# Patient Record
Sex: Female | Born: 1960 | Race: White | Hispanic: No | Marital: Married | Smoking: Never smoker
Health system: Southern US, Community
[De-identification: ages and names within clinical notes are randomized; demographics above are authoritative.]

## PROBLEM LIST (undated history)

## (undated) DIAGNOSIS — I10 Essential (primary) hypertension: Secondary | ICD-10-CM

## (undated) DIAGNOSIS — E7800 Pure hypercholesterolemia, unspecified: Secondary | ICD-10-CM

---

## 2001-12-05 HISTORY — PX: BREAST BIOPSY: SHX20

## 2007-08-21 ENCOUNTER — Ambulatory Visit (HOSPITAL_COMMUNITY): Admission: RE | Admit: 2007-08-21 | Discharge: 2007-08-21 | Payer: Self-pay | Admitting: Obstetrics and Gynecology

## 2007-08-23 ENCOUNTER — Encounter: Admission: RE | Admit: 2007-08-23 | Discharge: 2007-08-23 | Payer: Self-pay | Admitting: Obstetrics and Gynecology

## 2008-02-20 ENCOUNTER — Encounter: Admission: RE | Admit: 2008-02-20 | Discharge: 2008-02-20 | Payer: Self-pay | Admitting: Obstetrics & Gynecology

## 2008-02-20 ENCOUNTER — Encounter (INDEPENDENT_AMBULATORY_CARE_PROVIDER_SITE_OTHER): Payer: Self-pay | Admitting: Radiology

## 2009-07-28 ENCOUNTER — Encounter: Admission: RE | Admit: 2009-07-28 | Discharge: 2009-07-28 | Payer: Self-pay | Admitting: Obstetrics & Gynecology

## 2010-12-27 ENCOUNTER — Encounter: Payer: Self-pay | Admitting: Obstetrics & Gynecology

## 2013-10-07 ENCOUNTER — Other Ambulatory Visit (HOSPITAL_COMMUNITY): Payer: Self-pay | Admitting: Family Medicine

## 2013-10-07 DIAGNOSIS — Z139 Encounter for screening, unspecified: Secondary | ICD-10-CM

## 2013-10-10 ENCOUNTER — Ambulatory Visit (HOSPITAL_COMMUNITY)
Admission: RE | Admit: 2013-10-10 | Discharge: 2013-10-10 | Disposition: A | Discharge status: Home / Self Care | Payer: 59 | Source: Ambulatory Visit | Attending: Family Medicine | Admitting: Family Medicine

## 2013-10-10 DIAGNOSIS — Z139 Encounter for screening, unspecified: Secondary | ICD-10-CM

## 2013-10-10 DIAGNOSIS — Z1231 Encounter for screening mammogram for malignant neoplasm of breast: Secondary | ICD-10-CM | POA: Insufficient documentation

## 2015-06-14 ENCOUNTER — Emergency Department (HOSPITAL_COMMUNITY)
Admission: EM | Admit: 2015-06-14 | Discharge: 2015-06-14 | Disposition: A | Discharge status: Home / Self Care | Payer: 59 | Attending: Emergency Medicine | Admitting: Emergency Medicine

## 2015-06-14 ENCOUNTER — Encounter (HOSPITAL_COMMUNITY): Payer: Self-pay | Admitting: Emergency Medicine

## 2015-06-14 ENCOUNTER — Emergency Department (HOSPITAL_COMMUNITY): Payer: 59

## 2015-06-14 DIAGNOSIS — R109 Unspecified abdominal pain: Secondary | ICD-10-CM

## 2015-06-14 DIAGNOSIS — N83201 Unspecified ovarian cyst, right side: Secondary | ICD-10-CM

## 2015-06-14 DIAGNOSIS — E780 Pure hypercholesterolemia: Secondary | ICD-10-CM | POA: Diagnosis not present

## 2015-06-14 DIAGNOSIS — N8320 Unspecified ovarian cysts: Secondary | ICD-10-CM | POA: Diagnosis not present

## 2015-06-14 DIAGNOSIS — I10 Essential (primary) hypertension: Secondary | ICD-10-CM | POA: Insufficient documentation

## 2015-06-14 DIAGNOSIS — Z79899 Other long term (current) drug therapy: Secondary | ICD-10-CM | POA: Diagnosis not present

## 2015-06-14 DIAGNOSIS — R1031 Right lower quadrant pain: Secondary | ICD-10-CM | POA: Diagnosis present

## 2015-06-14 HISTORY — DX: Pure hypercholesterolemia, unspecified: E78.00

## 2015-06-14 HISTORY — DX: Essential (primary) hypertension: I10

## 2015-06-14 LAB — CBC WITH DIFFERENTIAL/PLATELET
BASOS PCT: 2 % — AB (ref 0–1)
Basophils Absolute: 0.1 10*3/uL (ref 0.0–0.1)
EOS ABS: 0.2 10*3/uL (ref 0.0–0.7)
EOS PCT: 4 % (ref 0–5)
HEMATOCRIT: 36.1 % (ref 36.0–46.0)
Hemoglobin: 11.9 g/dL — ABNORMAL LOW (ref 12.0–15.0)
Lymphocytes Relative: 32 % (ref 12–46)
Lymphs Abs: 1.6 10*3/uL (ref 0.7–4.0)
MCH: 27.9 pg (ref 26.0–34.0)
MCHC: 33 g/dL (ref 30.0–36.0)
MCV: 84.5 fL (ref 78.0–100.0)
MONO ABS: 0.4 10*3/uL (ref 0.1–1.0)
MONOS PCT: 8 % (ref 3–12)
Neutro Abs: 2.6 10*3/uL (ref 1.7–7.7)
Neutrophils Relative %: 54 % (ref 43–77)
Platelets: 249 10*3/uL (ref 150–400)
RBC: 4.27 MIL/uL (ref 3.87–5.11)
RDW: 13.8 % (ref 11.5–15.5)
WBC: 4.8 10*3/uL (ref 4.0–10.5)

## 2015-06-14 LAB — COMPREHENSIVE METABOLIC PANEL
ALK PHOS: 44 U/L (ref 38–126)
ALT: 14 U/L (ref 14–54)
AST: 16 U/L (ref 15–41)
Albumin: 3.9 g/dL (ref 3.5–5.0)
Anion gap: 9 (ref 5–15)
BILIRUBIN TOTAL: 0.9 mg/dL (ref 0.3–1.2)
BUN: 10 mg/dL (ref 6–20)
CHLORIDE: 102 mmol/L (ref 101–111)
CO2: 27 mmol/L (ref 22–32)
CREATININE: 0.66 mg/dL (ref 0.44–1.00)
Calcium: 9 mg/dL (ref 8.9–10.3)
GFR calc Af Amer: >60 mL/min (ref >60)
Glucose, Bld: 94 mg/dL (ref 65–99)
POTASSIUM: 3.5 mmol/L (ref 3.5–5.1)
SODIUM: 138 mmol/L (ref 135–145)
Total Protein: 7.3 g/dL (ref 6.5–8.1)

## 2015-06-14 LAB — URINALYSIS, ROUTINE W REFLEX MICROSCOPIC
BILIRUBIN URINE: NEGATIVE
Glucose, UA: NEGATIVE mg/dL
HGB URINE DIPSTICK: NEGATIVE
Ketones, ur: NEGATIVE mg/dL
Leukocytes, UA: NEGATIVE
NITRITE: NEGATIVE
PH: 6 (ref 5.0–8.0)
Protein, ur: NEGATIVE mg/dL
SPECIFIC GRAVITY, URINE: 1.01 (ref 1.005–1.030)
Urobilinogen, UA: 0.2 mg/dL (ref 0.0–1.0)

## 2015-06-14 LAB — LIPASE, BLOOD: LIPASE: 18 U/L — AB (ref 22–51)

## 2015-06-14 MED ORDER — IOHEXOL 300 MG/ML  SOLN
50.0000 mL | Freq: Once | INTRAMUSCULAR | Status: AC | PRN
  Administered 2015-06-14: 50 mL via ORAL

## 2015-06-14 MED ORDER — SODIUM CHLORIDE 0.9 % IV BOLUS (SEPSIS)
1000.0000 mL | Freq: Once | INTRAVENOUS | Status: AC
  Administered 2015-06-14: 1000 mL via INTRAVENOUS

## 2015-06-14 MED ORDER — SODIUM CHLORIDE 0.9 % IV SOLN
INTRAVENOUS | Status: DC
  Administered 2015-06-14: 13:00:00 via INTRAVENOUS

## 2015-06-14 MED ORDER — TRAMADOL HCL 50 MG PO TABS: 50.0000 mg | ORAL_TABLET | Freq: Four times a day (QID) | ORAL | Status: AC | PRN

## 2015-06-14 MED ORDER — ONDANSETRON HCL 4 MG/2ML IJ SOLN
4.0000 mg | Freq: Once | INTRAMUSCULAR | Status: AC
  Administered 2015-06-14: 4 mg via INTRAVENOUS
  Filled 2015-06-14: qty 2

## 2015-06-14 MED ORDER — FENTANYL CITRATE (PF) 100 MCG/2ML IJ SOLN
50.0000 ug | Freq: Once | INTRAMUSCULAR | Status: AC
  Administered 2015-06-14: 50 ug via INTRAVENOUS
  Filled 2015-06-14: qty 2

## 2015-06-14 MED ORDER — HYDROCODONE-ACETAMINOPHEN 5-325 MG PO TABS: 1.0000 | ORAL_TABLET | Freq: Four times a day (QID) | ORAL | Status: AC | PRN

## 2015-06-14 MED ORDER — IOHEXOL 300 MG/ML  SOLN
100.0000 mL | Freq: Once | INTRAMUSCULAR | Status: AC | PRN
  Administered 2015-06-14: 100 mL via INTRAVENOUS

## 2015-06-14 NOTE — Discharge Instructions (Signed)
Prepare point we are record doctors scheduled for this week. Also important to follow-up with your GYN doctor as well. CT scan without significant findings other than a right ovarian cyst. Not clear whether this is responsible for all your pain. Take the tramadol on a regular basis. Supplement with hydrocodone as needed for more severe pain. Return for any new or worse symptoms.

## 2015-06-14 NOTE — ED Notes (Signed)
Patient c/o intermittent right side abd pain that started on Wednesday that is progressively getting worse. Denies any nausea, vomiting, diarrhea, or urinary symptoms. Per patient has not ate since yesterday. Per patient last BM last night, no blood noted. Per patient unsure of fever but did have some chills this morning.

## 2015-06-14 NOTE — ED Provider Notes (Signed)
CSN: [id number]     Arrival date & time 06/14/15  1143 History  This chart was scribed for Vanetta MuldersScott Isidore Margraf, MD by Elon SpannerGarrett Cook, ED Scribe. This patient was seen in room APA19/APA19 and the patient's care was started at 12:08 PM.   Chief Complaint  Patient presents with  . Abdominal Pain   HPI HPI Comments: Alexandra Estrada is a [age] y.o. female who presents to the Emergency Department complaining of waxing and waning RUQ pain with radiation to the back and RLQ onset 4 days ago.  At worse her pain was 8/10 but it is currently rated 6/10.  She also reports associated belching, bloating, chills this morning, and slight nausea.  She denies vomiting, dysuria, hematuria, fever.   Past Medical History  Diagnosis Date  . Hypertension   . High cholesterol    Past Surgical History  Procedure Laterality Date  . Cesarean section     Family History  Problem Relation Age of Onset  . Diabetes Other    History  Substance Use Topics  . Smoking status: Never Smoker   . Smokeless tobacco: Never Used  . Alcohol Use: No   OB History    Gravida Para Term Preterm AB TAB SAB Ectopic Multiple Living   3 3 3       3      Review of Systems  Constitutional: Positive for chills. Negative for fever.  HENT: Negative for rhinorrhea and sore throat.   Eyes: Negative for visual disturbance.  Respiratory: Positive for cough. Negative for shortness of breath.   Cardiovascular: Negative for chest pain and leg swelling.  Gastrointestinal: Positive for nausea and abdominal pain. Negative for vomiting and diarrhea.  Genitourinary: Negative for dysuria and hematuria.  Musculoskeletal: Positive for back pain. Negative for neck pain.  Skin: Negative for rash.  Neurological: Negative for headaches.  Hematological: Does not bruise/bleed easily.  Psychiatric/Behavioral: Negative for confusion.      Allergies  Bee venom; Beef-derived products; and Pork-derived products  Home Medications   Prior to Admission  medications   Medication Sig Start Date End Date Taking? Authorizing Provider  acetaminophen (TYLENOL) 500 MG tablet Take 500 mg by mouth every 6 (six) hours as needed for moderate pain.   Yes Historical Provider, MD  Cholecalciferol (VITAMIN D PO) Take 1 tablet by mouth daily.   Yes Historical Provider, MD  Cyanocobalamin (VITAMIN B 12 PO) Take 1 tablet by mouth daily.   Yes Historical Provider, MD  lisinopril-hydrochlorothiazide (PRINZIDE,ZESTORETIC) 20-25 MG per tablet Take 1 tablet by mouth daily.   Yes Historical Provider, MD  montelukast (SINGULAIR) 10 MG tablet Take 10 mg by mouth at bedtime.   Yes Historical Provider, MD  HYDROcodone-acetaminophen (NORCO/VICODIN) 5-325 MG per tablet Take 1-2 tablets by mouth every 6 (six) hours as needed. 06/14/15   Vanetta MuldersScott Juell Radney, MD  traMADol (ULTRAM) 50 MG tablet Take 1 tablet (50 mg total) by mouth every 6 (six) hours as needed. 06/14/15   Vanetta MuldersScott Abbiegail Landgren, MD   BP 145/75 mmHg  Pulse 78  Temp(Src) 98.1 F (36.7 C) (Oral)  Resp 20  Ht 5\' 8"  (1.727 m)  Wt 190 lb (86.183 kg)  BMI 28.90 kg/m2  SpO2 100%  LMP 05/18/2015 Physical Exam  Constitutional: She is oriented to person, place, and time. She appears well-developed and well-nourished. No distress.  HENT:  Head: Normocephalic and atraumatic.  Eyes: Conjunctivae and EOM are normal.  Sclera normal.  Eyes track normal.  Pupils normal.  Neck: Neck supple. No  tracheal deviation present.  Cardiovascular: Normal rate and regular rhythm.   Pulmonary/Chest: Effort normal. No respiratory distress.  Lungs CTA bilaterally.   Abdominal: Bowel sounds are normal. There is tenderness.  LLQ and RLQ pain.  RUQ pain greater than other areas.    Musculoskeletal: Normal range of motion. She exhibits no edema.  No ankle swelling.   Neurological: She is alert and oriented to person, place, and time.  Skin: Skin is warm and dry.  Psychiatric: She has a normal mood and affect. Her behavior is normal.  Nursing  note and vitals reviewed.   ED Course  Procedures (including critical care time)  DIAGNOSTIC STUDIES: Oxygen Saturation is 100% on RA, normal by my interpretation.    COORDINATION OF CARE:  12:16 PM Discussed treatment plan with patient at bedside.  Patient acknowledges and agrees with plan.    Labs Review Labs Reviewed  LIPASE, BLOOD - Abnormal; Notable for the following:    Lipase 18 (*)    All other components within normal limits  CBC WITH DIFFERENTIAL/PLATELET - Abnormal; Notable for the following:    Hemoglobin 11.9 (*)    Basophils Relative 2 (*)    All other components within normal limits  URINALYSIS, ROUTINE W REFLEX MICROSCOPIC (NOT AT [facility])  COMPREHENSIVE METABOLIC PANEL   Results for orders placed or performed during the hospital encounter of 06/14/15  Lipase, blood  Result Value Ref Range   Lipase 18 (L) 22 - 51 U/L  Urinalysis, Routine w reflex microscopic (not at [facility])  Result Value Ref Range   Color, Urine YELLOW YELLOW   APPearance CLEAR CLEAR   Specific Gravity, Urine 1.010 1.005 - 1.030   pH 6.0 5.0 - 8.0   Glucose, UA NEGATIVE NEGATIVE mg/dL   Hgb urine dipstick NEGATIVE NEGATIVE   Bilirubin Urine NEGATIVE NEGATIVE   Ketones, ur NEGATIVE NEGATIVE mg/dL   Protein, ur NEGATIVE NEGATIVE mg/dL   Urobilinogen, UA 0.2 0.0 - 1.0 mg/dL   Nitrite NEGATIVE NEGATIVE   Leukocytes, UA NEGATIVE NEGATIVE  Comprehensive metabolic panel  Result Value Ref Range   Sodium 138 135 - 145 mmol/L   Potassium 3.5 3.5 - 5.1 mmol/L   Chloride 102 101 - 111 mmol/L   CO2 27 22 - 32 mmol/L   Glucose, Bld 94 65 - 99 mg/dL   BUN 10 6 - 20 mg/dL   Creatinine, Ser 1.61 0.44 - 1.00 mg/dL   Calcium 9.0 8.9 - 09.6 mg/dL   Total Protein 7.3 6.5 - 8.1 g/dL   Albumin 3.9 3.5 - 5.0 g/dL   AST 16 15 - 41 U/L   ALT 14 14 - 54 U/L   Alkaline Phosphatase 44 38 - 126 U/L   Total Bilirubin 0.9 0.3 - 1.2 mg/dL   GFR calc non Af Amer >60 >60 mL/min   GFR calc Af Amer >60 >60 mL/min    Anion gap 9 5 - 15  CBC with Differential/Platelet  Result Value Ref Range   WBC 4.8 4.0 - 10.5 K/uL   RBC 4.27 3.87 - 5.11 MIL/uL   Hemoglobin 11.9 (L) 12.0 - 15.0 g/dL   HCT 04.5 40.9 - 81.1 %   MCV 84.5 78.0 - 100.0 fL   MCH 27.9 26.0 - 34.0 pg   MCHC 33.0 30.0 - 36.0 g/dL   RDW 91.4 78.2 - 95.6 %   Platelets 249 150 - 400 K/uL   Neutrophils Relative % 54 43 - 77 %   Neutro Abs 2.6 1.7 -  7.7 K/uL   Lymphocytes Relative 32 12 - 46 %   Lymphs Abs 1.6 0.7 - 4.0 K/uL   Monocytes Relative 8 3 - 12 %   Monocytes Absolute 0.4 0.1 - 1.0 K/uL   Eosinophils Relative 4 0 - 5 %   Eosinophils Absolute 0.2 0.0 - 0.7 K/uL   Basophils Relative 2 (H) 0 - 1 %   Basophils Absolute 0.1 0.0 - 0.1 K/uL     Imaging Review Dg Chest 2 View  06/14/2015   CLINICAL DATA:  Right-sided chest pain which began on Wednesday. Progressively getting worse.  EXAM: CHEST  2 VIEW  COMPARISON:  None.  FINDINGS: The heart size and mediastinal contours are within normal limits. Both lungs are clear. The visualized skeletal structures are unremarkable.  IMPRESSION: Normal chest x-ray.   Electronically Signed   By: Rudie Meyer M.D.   On: 06/14/2015 14:32   Ct Abdomen Pelvis W Contrast  06/14/2015   CLINICAL DATA:  Intermittent right-sided abdominal pain since Wednesday, progressively getting worse.  EXAM: CT ABDOMEN AND PELVIS WITH CONTRAST  TECHNIQUE: Multidetector CT imaging of the abdomen and pelvis was performed using the standard protocol following bolus administration of intravenous contrast.  CONTRAST:  50mL OMNIPAQUE IOHEXOL 300 MG/ML SOLN, OMNIPAQUE IOHEXOL 300 MG/ML SOLN  COMPARISON:  None.  FINDINGS: Lower chest: The lung bases are clear of acute process. No pleural effusion or pulmonary lesions. The heart is normal in size. No pericardial effusion. The distal esophagus and aorta are unremarkable.  Hepatobiliary: No focal hepatic lesions or intrahepatic biliary dilatation. The gallbladder is normal. No  common bile duct dilatation.  Pancreas: No mass lesions, inflammatory changes or ductal dilatation.  Spleen: Normal size.  No focal lesions.  Adrenals/Urinary Tract: The adrenal glands are normal.  Both kidneys are normal. No obstructing ureteral calculi or bladder calculi  Stomach/Bowel: The stomach, duodenum, small bowel and colon are unremarkable. No inflammatory changes, mass lesions or obstructive findings. The terminal ileum is normal. The appendix is normal.  Vascular/Lymphatic: No mesenteric or retroperitoneal mass or adenopathy. Small scattered lymph nodes are noted. The aorta and branch vessels are normal. The major venous structures are patent.  Other: The uterus and ovaries are unremarkable. There is a simple appearing 4 cm cyst associated with the right ovary. No pelvic mass or adenopathy. No free pelvic fluid collections. No inguinal mass or adenopathy.  Musculoskeletal: No significant bony findings. Left convex lumbar scoliosis and advanced degenerative disc disease at L5-S1.  IMPRESSION: No acute abdominal/ pelvic findings, mass lesions or adenopathy.  Simple appearing 4 cm cyst associated with the right ovary.   Electronically Signed   By: Rudie Meyer M.D.   On: 06/14/2015 14:39     EKG Interpretation   Date/Time:  Sunday June 14 2015 13:24:07 EDT Ventricular Rate:  61 PR Interval:  164 QRS Duration: 108 QT Interval:  425 QTC Calculation: 428 R Axis:   59 Text Interpretation:  Sinus rhythm RSR' in V1 or V2, right VCD or RVH No  previous ECGs available Confirmed by Ryen Rhames  MD, Marilynn Ekstein [phone]) on  06/14/2015 2:04:32 PM      MDM   Final diagnoses:  Abdominal pain  Cyst of right ovary    Patient with right-sided abdominal pain the pain may be related to the finding of the ovarian cyst. But not completely certain all the pain is related to that. Otherwise workup negative. Labs normal no leukocytosis no anemia no liver function test abnormalities lipase  is normal not consistent  with pancreatitis. And urinalysis negative for urinary tract infection. Patient has follow-up with both her doctors this week including her regular doctor and her GYN doctor. Would recommend that. Symptoms persist ultrasound of gallbladder may be appropriate and also colonoscopy may be appropriate. To quickly if the ovarian cyst resolves and the pain persists. Will treat symptomatically. Patient nontoxic no acute distress.  I personally performed the services described in this documentation, which was scribed in my presence. The recorded information has been reviewed and is accurate.     Vanetta Mulders, MD 06/14/15 1510

## 2016-03-07 IMAGING — CT CT ABD-PELV W/ CM
2 of 5 series · 16 of 46 positions shown, 18 images · IV contrast (Omnipaque 300)
Comparison: None.

CLINICAL DATA: Intermittent right-sided abdominal pain since
[REDACTED], progressively getting worse.

EXAM:
CT ABDOMEN AND PELVIS WITH CONTRAST
TECHNIQUE: Multidetector CT imaging of the abdomen and pelvis was performed
using the standard protocol following bolus administration of
intravenous contrast.
CONTRAST:  50mL OMNIPAQUE IOHEXOL 300 MG/ML SOLN, 100mL OMNIPAQUE
IOHEXOL 300 MG/ML SOLN

[Series 2: abd_pel_with 5.0 b40f · axial · 0.78mm/px · z∈[-478,-72]mm · 13 of 93 slices shown, 15 images]
[im 6/93  soft-tissue]
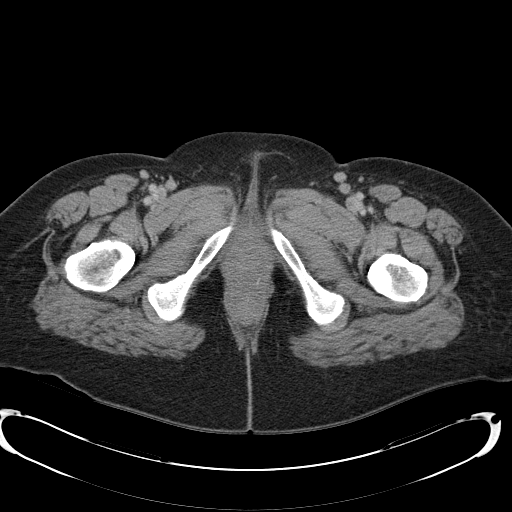
[im 6/93  bone]
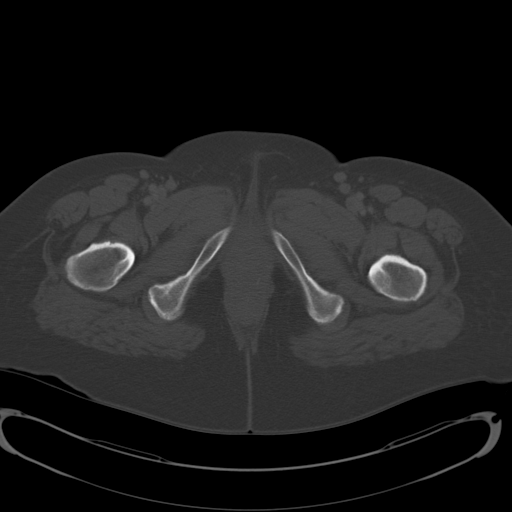
[im 11/93  soft-tissue]
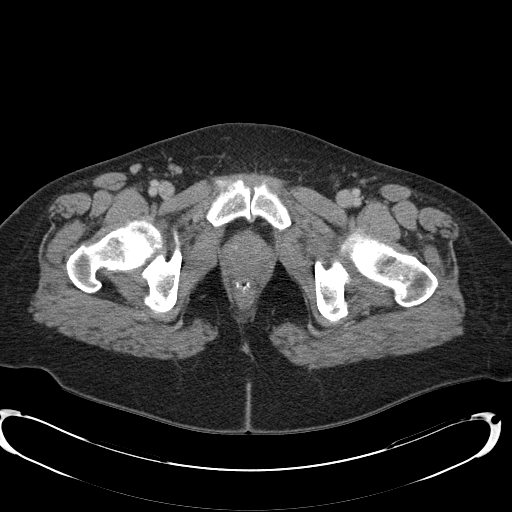
[im 22/93  soft-tissue]
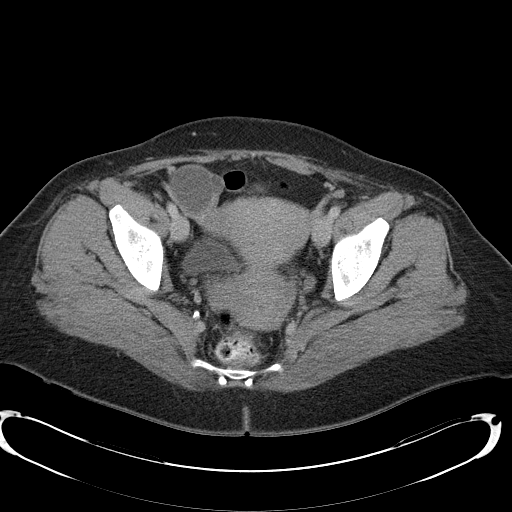
[im 28/93  soft-tissue]
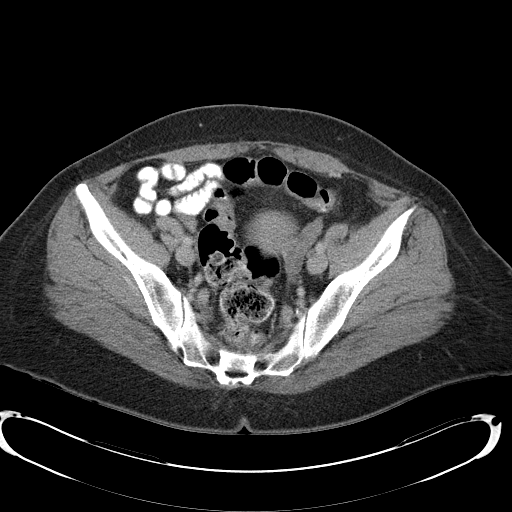
[im 33/93  soft-tissue]
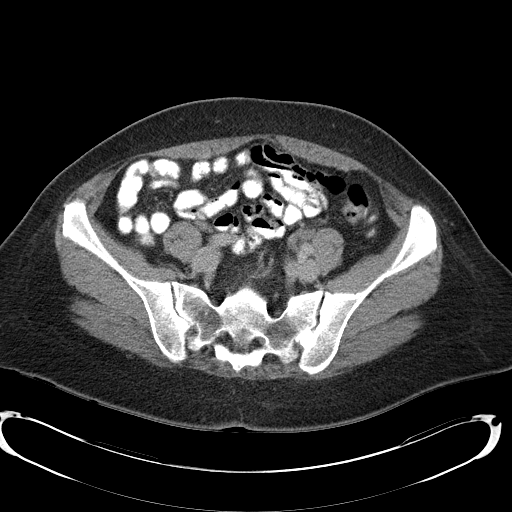
[im 38/93  soft-tissue]
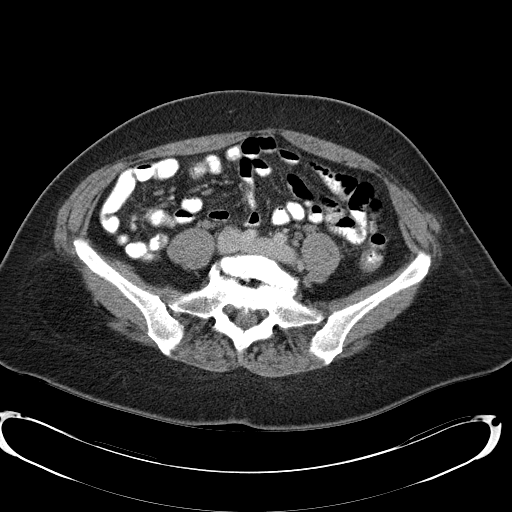
[im 49/93  soft-tissue]
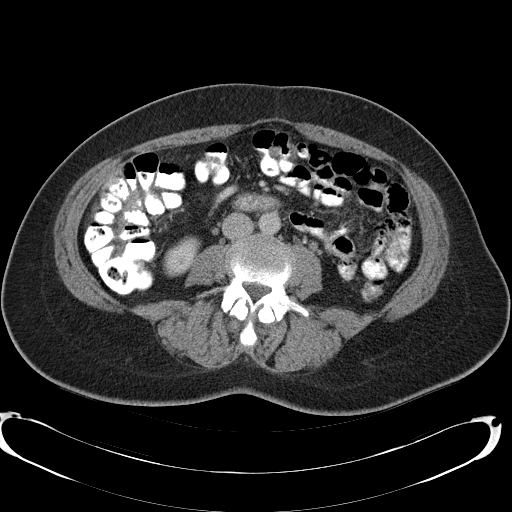
[im 55/93  soft-tissue]
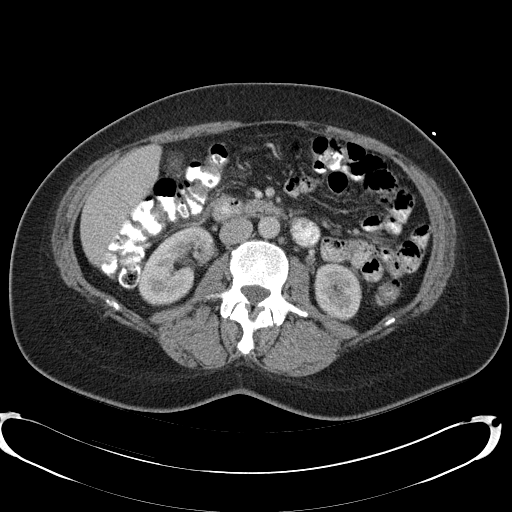
[im 60/93  soft-tissue]
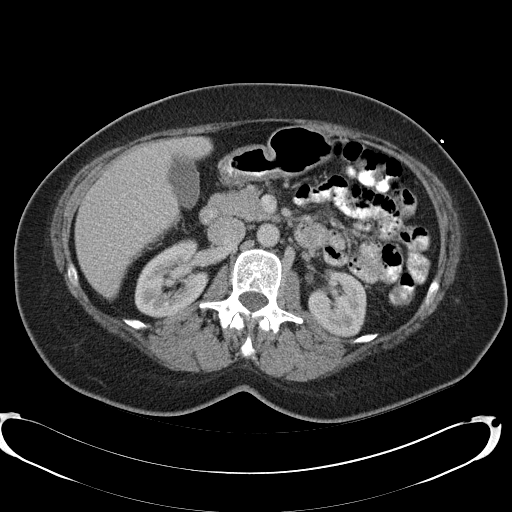
[im 60/93  bone]
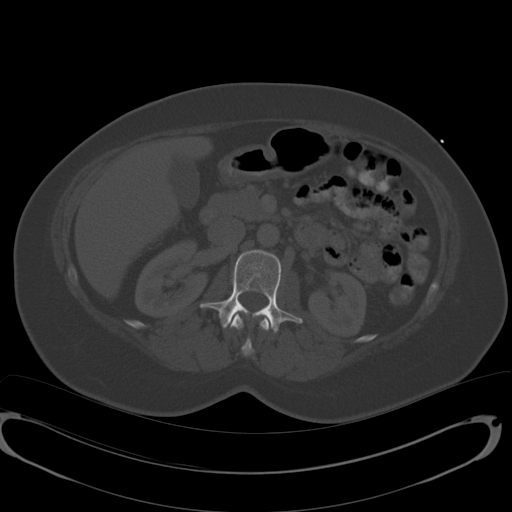
[im 65/93  soft-tissue]
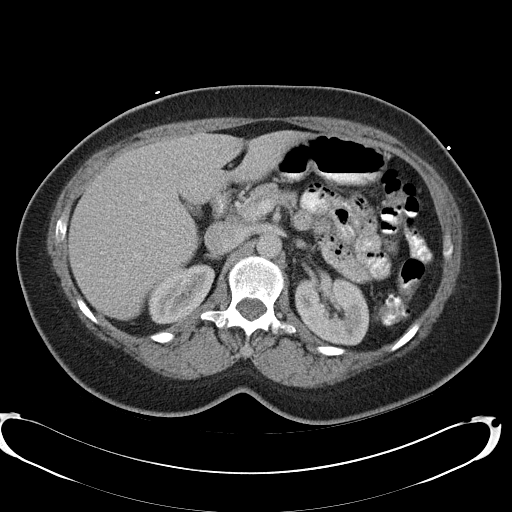
[im 71/93  soft-tissue]
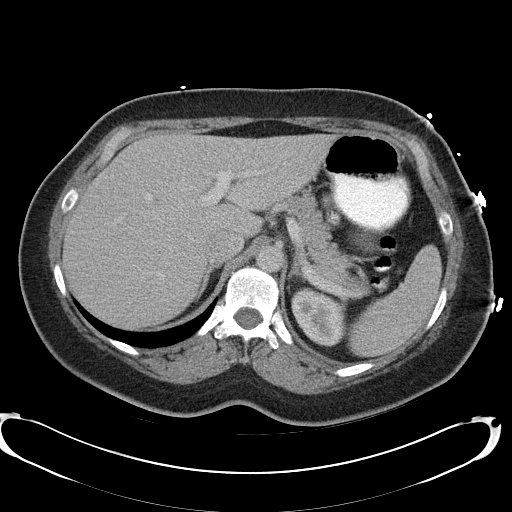
[im 82/93  soft-tissue]
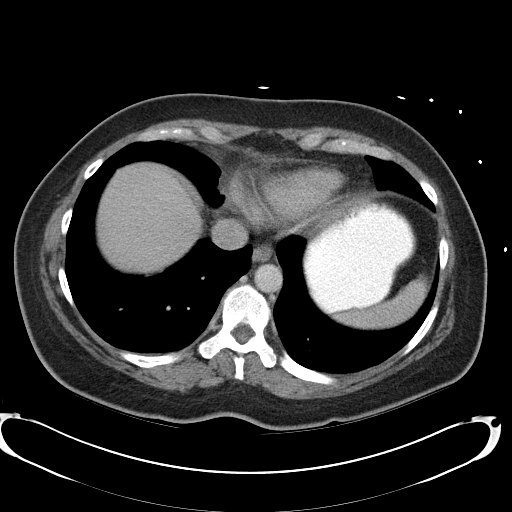
[im 87/93  soft-tissue]
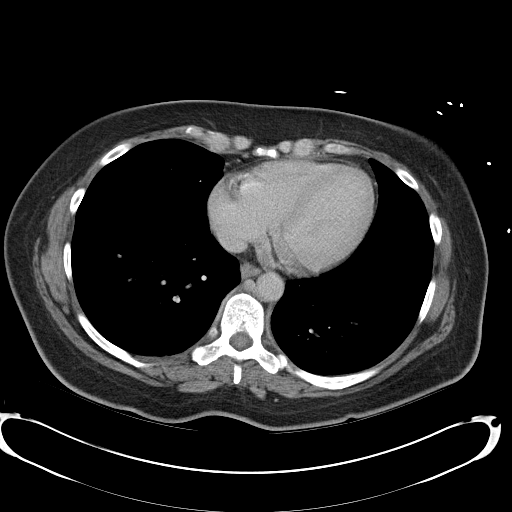

[Series 3: abd_pel_with 3.0 spo cor · coronal · 0.74mm/px · 3 of 86 slices shown]
[im 29/86  soft-tissue]
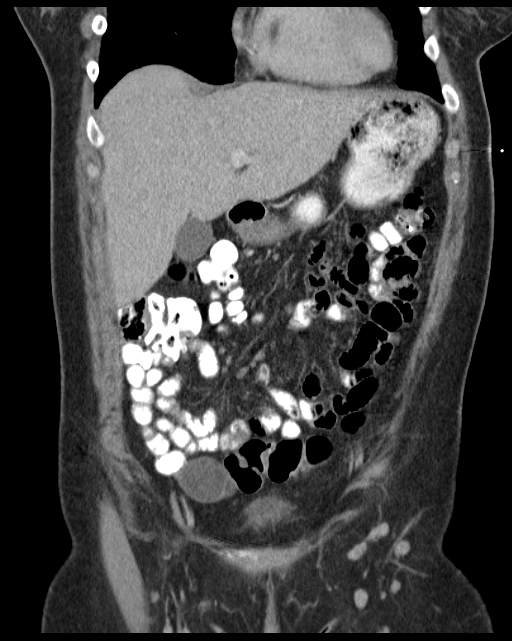
[im 38/86  soft-tissue]
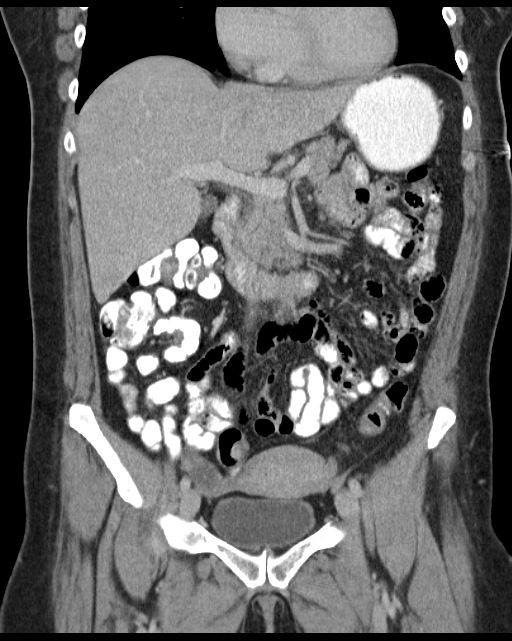
[im 48/86  soft-tissue]
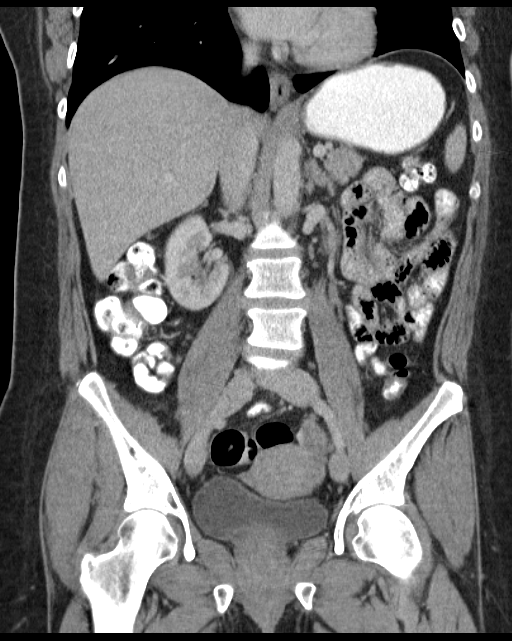

[16 of 46 positions shown; findings below may reference images not displayed]

FINDINGS: Lower chest: The lung bases are clear of acute process. No pleural
effusion or pulmonary lesions. The heart is normal in size. No
pericardial effusion. The distal esophagus and aorta are
unremarkable.

Hepatobiliary: No focal hepatic lesions or intrahepatic biliary
dilatation. The gallbladder is normal. No common bile duct
dilatation.

Pancreas: No mass lesions, inflammatory changes or ductal
dilatation.

Spleen: Normal size.  No focal lesions.

Adrenals/Urinary Tract: The adrenal glands are normal.

Both kidneys are normal. No obstructing ureteral calculi or bladder
calculi

Stomach/Bowel: The stomach, duodenum, small bowel and colon are
unremarkable. No inflammatory changes, mass lesions or obstructive
findings. The terminal ileum is normal. The appendix is normal.

Vascular/Lymphatic: No mesenteric or retroperitoneal mass or
adenopathy. Small scattered lymph nodes are noted. The aorta and
branch vessels are normal. The major venous structures are patent.

Other: The uterus and ovaries are unremarkable. There is a simple
appearing 4 cm cyst associated with the right ovary. No pelvic mass
or adenopathy. No free pelvic fluid collections. No inguinal mass or
adenopathy.

Musculoskeletal: No significant bony findings. Left convex lumbar
scoliosis and advanced degenerative disc disease at L5-S1.
IMPRESSION: No acute abdominal/ pelvic findings, mass lesions or adenopathy.

Simple appearing 4 cm cyst associated with the right ovary.

## 2017-11-06 ENCOUNTER — Other Ambulatory Visit (HOSPITAL_COMMUNITY): Payer: Self-pay | Admitting: Family Medicine

## 2017-11-06 DIAGNOSIS — Z1231 Encounter for screening mammogram for malignant neoplasm of breast: Secondary | ICD-10-CM

## 2017-11-09 ENCOUNTER — Ambulatory Visit (HOSPITAL_COMMUNITY)
Admission: RE | Admit: 2017-11-09 | Discharge: 2017-11-09 | Disposition: A | Discharge status: Home / Self Care | Payer: BLUE CROSS/BLUE SHIELD | Source: Ambulatory Visit | Attending: Family Medicine | Admitting: Family Medicine

## 2017-11-09 ENCOUNTER — Encounter (HOSPITAL_COMMUNITY): Payer: Self-pay | Admitting: Radiology

## 2017-11-09 DIAGNOSIS — Z1231 Encounter for screening mammogram for malignant neoplasm of breast: Secondary | ICD-10-CM

## 2019-07-23 ENCOUNTER — Encounter (INDEPENDENT_AMBULATORY_CARE_PROVIDER_SITE_OTHER): Payer: Self-pay | Admitting: *Deleted

## 2019-07-31 ENCOUNTER — Encounter (INDEPENDENT_AMBULATORY_CARE_PROVIDER_SITE_OTHER): Payer: Self-pay | Admitting: *Deleted
